# Patient Record
Sex: Male | Born: 1940 | Race: White | Hispanic: No | Marital: Married
Health system: Southern US, Community
[De-identification: ages and names within clinical notes are randomized; demographics above are authoritative.]

## PROBLEM LIST (undated history)

## (undated) DIAGNOSIS — H9190 Unspecified hearing loss, unspecified ear: Secondary | ICD-10-CM

## (undated) DIAGNOSIS — E7800 Pure hypercholesterolemia, unspecified: Secondary | ICD-10-CM

## (undated) HISTORY — PX: HERNIA REPAIR: SHX51

---

## 2009-07-05 ENCOUNTER — Ambulatory Visit: Payer: Self-pay | Admitting: Internal Medicine

## 2011-01-11 ENCOUNTER — Ambulatory Visit: Payer: Self-pay

## 2011-03-28 ENCOUNTER — Ambulatory Visit: Payer: Self-pay

## 2011-10-22 ENCOUNTER — Ambulatory Visit: Payer: Self-pay | Admitting: Internal Medicine

## 2015-02-05 ENCOUNTER — Ambulatory Visit
Admission: EM | Admit: 2015-02-05 | Discharge: 2015-02-05 | Disposition: A | Discharge status: Home / Self Care | Payer: Medicare Other | Attending: Family Medicine | Admitting: Family Medicine

## 2015-02-05 DIAGNOSIS — H9201 Otalgia, right ear: Secondary | ICD-10-CM

## 2015-02-05 HISTORY — DX: Pure hypercholesterolemia, unspecified: E78.00

## 2015-02-05 HISTORY — DX: Unspecified hearing loss, unspecified ear: H91.90

## 2015-02-05 NOTE — Discharge Instructions (Signed)
Follow up with ENT next week as discussed as needed. Return to Urgent care as needed for new or worsening concerns.   Earache An earache, also called otalgia, can be caused by many things. Pain from an earache can be sharp, dull, or burning. The pain may be temporary or constant. Earaches can be caused by problems with the ear, such as infection in either the middle ear or the ear canal, injury, impacted ear wax, middle ear pressure, or a foreign body in the ear. Ear pain can also result from problems in other areas. This is called referred pain. For example, pain can come from a sore throat, a tooth infection, or problems with the jaw or the joint between the jaw and the skull (temporomandibular joint, or TMJ). The cause of an earache is not always easy to identify. Watchful waiting may be appropriate for some earaches until a clear cause of the pain can be found. HOME CARE INSTRUCTIONS Watch your condition for any changes. The following actions may help to lessen any discomfort that you are feeling:  Take medicines only as directed by your health care provider. This includes ear drops.  Apply ice to your outer ear to help reduce pain.  Put ice in a plastic bag.  Place a towel between your skin and the bag.  Leave the ice on for 20 minutes, 2-3 times per day.  Do not put anything in your ear other than medicine that is prescribed by your health care provider.  Try resting in an upright position instead of lying down. This may help to reduce pressure in the middle ear and relieve pain.  Chew gum if it helps to relieve your ear pain.  Control any allergies that you have.  Keep all follow-up visits as directed by your health care provider. This is important. SEEK MEDICAL CARE IF:  Your pain does not improve within 2 days.  You have a fever.  You have new or worsening symptoms. SEEK IMMEDIATE MEDICAL CARE IF:  You have a severe headache.  You have a stiff neck.  You have  difficulty swallowing.  You have redness or swelling behind your ear.  You have drainage from your ear.  You have hearing loss.  You feel dizzy.   This information is not intended to replace advice given to you by your health care provider. Make sure you discuss any questions you have with your health care provider.   Document Released: 10/14/2003 Document Revised: 03/19/2014 Document Reviewed: 09/27/2013 Elsevier Interactive Patient Education [employer]

## 2015-02-05 NOTE — ED Provider Notes (Signed)
[facility]  ____________________________________________  Time seen: Approximately 12:17 PM  I have reviewed the triage vital signs and the nursing notes.   HISTORY  Chief Complaint Otalgia   HPI Sheron Nightingaledrian B Broeker is a 74 y.o. male presents with wife at bedside for the complaints of right ear discomfort. States right ear discomfort is not really a pain but and aggravation. States mild ache. States that this has been for 3 days. States that he is concerned that he may have a part of his hearing aid present in his ear. States that they small and piece of his hearing aid fell off and states wanted to make sure that it is not in his right ear. Denies discharge. Denies change in hearing. Patient does report that he has some chronic tinnitus, but denies any changes in that. Denies fall or trauma or head injury. Denies fevers, neck pain, facial pain or other complaints. Reports continues to eat and drink well. Denies recent cough, congestion, nasal drainage or sickness.   States has followed with Dr Irving ShowsJuengal ENT in past for tinnitus.    Past Medical History  Diagnosis Date  . High cholesterol   . Hard of hearing     There are no active problems to display for this patient.   Past Surgical History  Procedure Laterality Date  . Hernia repair      No current outpatient prescriptions on file.  Allergies Review of patient's allergies indicates no known allergies.  No family history on file.  Social History Social History  Substance Use Topics  . Smoking status: Unknown If Ever Smoked  . Smokeless tobacco: Not on file  . Alcohol Use: No    Review of Systems Constitutional: No fever/chills Eyes: No visual changes. ENT: No sore throat.right ear discomfort.  Cardiovascular: Denies chest pain. Respiratory: Denies shortness of breath. Gastrointestinal: No abdominal pain.  No nausea, no vomiting.  No diarrhea.  No constipation. Genitourinary: Negative for  dysuria. Musculoskeletal: Negative for back pain. Skin: Negative for rash. Neurological: Negative for headaches, focal weakness or numbness.  10-point ROS otherwise negative.  ____________________________________________   PHYSICAL EXAM:  VITAL SIGNS: ED Triage Vitals  Enc Vitals Group     BP 02/05/15 1057 164/85 mmHg     Pulse Rate 02/05/15 1057 70     Resp 02/05/15 1057 16     Temp 02/05/15 1057 98 F (36.7 C)     Temp Source 02/05/15 1057 Oral     SpO2 02/05/15 1057 100 %     Weight 02/05/15 1057 160 lb (72.576 kg)     Height 02/05/15 1057 5' 8.5" (1.74 m)     Head Cir --      Peak Flow --      Pain Score 02/05/15 1103 3     Pain Loc --      Pain Edu? --      Excl. in GC? --      Constitutional: Alert and oriented. Well appearing and in no acute distress. Eyes: Conjunctivae are normal. PERRL. EOMI. Head: Atraumatic.non tender. No sinus TTP. No TMJ tenderness. No erythema.   Ears: Left: no erythema, normal TM, nontender. Right: no erythema, mild cerumen present, nontender, TM intact, no foreign body, nontender. Hearing grossly intact bilaterally.  Nose: No congestion/rhinnorhea.  Mouth/Throat: Mucous membranes are moist.  Oropharynx non-erythematous. Neck: No stridor.  No cervical spine tenderness to palpation. Hematological/Lymphatic/Immunilogical: No cervical lymphadenopathy. Cardiovascular: Normal rate, regular rhythm. Grossly normal heart sounds.  Good peripheral circulation.  Respiratory: Normal respiratory effort.  No retractions. Lungs CTAB. Gastrointestinal: Soft and nontender. No distention. Normal Bowel sounds.   Musculoskeletal: No lower or upper extremity tenderness nor edema.   Neurologic:  Normal speech and language. No gross focal neurologic deficits are appreciated. No gait instability. Skin:  Skin is warm, dry and intact. No rash noted. Psychiatric: Mood and affect are normal. Speech and behavior are  normal.  ____________________________________________   LABS (all labs ordered are listed, but only abnormal results are displayed)  Labs Reviewed - No data to display   PROCEDURES  Procedure(s) performed:   Right ear irrigated by RN. Post irrigation reexamined. No cerumen present. No foreign body. TM normal in appearance. ____________________________________________   INITIAL IMPRESSION / ASSESSMENT AND PLAN / ED COURSE  Pertinent labs & imaging results that were available during my care of the patient were reviewed by me and considered in my medical decision making (see chart for details).  Very well-appearing patient. No acute distress. Presents for the complaints of right ear discomfort. States concerned that part of his hearing aid is in his ear. States wanted to make sure there was not anything in his ear. No foreign body seen. Irrigated to ensure no foreign bodies, and post irrigation no foreign body seen. No signs of infection. Denies hearing changes. Reports some chronic intermittent tinnitus. Denies tinnitus changes. Denies other complaints. Counseled regarding avoidance of frequent irritation to right ear as well as avoidance of any foreign bodies. Discussed monitoring. Follow-up with Dr. Irving Shows  ENT next week as needed. Patient and spouse verbal understanding.  Discussed follow up with Primary care physician this week. Discussed follow up and return parameters including no resolution or any worsening concerns. Patient verbalized understanding and agreed to plan.   ____________________________________________   FINAL CLINICAL IMPRESSION(S) / ED DIAGNOSES  Final diagnoses:  Otalgia, right       Renford Dills, NP 02/05/15 1242  Renford Dills, NP 02/05/15 1242

## 2016-11-07 ENCOUNTER — Other Ambulatory Visit: Payer: Self-pay | Admitting: Orthopedic Surgery

## 2016-11-07 DIAGNOSIS — M7582 Other shoulder lesions, left shoulder: Secondary | ICD-10-CM

## 2016-11-13 ENCOUNTER — Ambulatory Visit
Admission: RE | Admit: 2016-11-13 | Discharge: 2016-11-13 | Disposition: A | Discharge status: Home / Self Care | Payer: Medicare Other | Source: Ambulatory Visit | Attending: Orthopedic Surgery | Admitting: Orthopedic Surgery

## 2016-11-13 DIAGNOSIS — M75102 Unspecified rotator cuff tear or rupture of left shoulder, not specified as traumatic: Secondary | ICD-10-CM | POA: Insufficient documentation

## 2016-11-13 DIAGNOSIS — M19012 Primary osteoarthritis, left shoulder: Secondary | ICD-10-CM | POA: Diagnosis not present

## 2016-11-13 DIAGNOSIS — M7552 Bursitis of left shoulder: Secondary | ICD-10-CM | POA: Diagnosis not present

## 2016-11-13 DIAGNOSIS — M7582 Other shoulder lesions, left shoulder: Secondary | ICD-10-CM | POA: Diagnosis present

## 2018-02-01 IMAGING — MR MR SHOULDER*L* W/O CM
4 series · 40 of 40 positions shown · non-contrast
Comparison: None.

CLINICAL DATA: Lateral left shoulder pain and limited range of
motion for 2 months. No known injury.

EXAM:
MRI OF THE LEFT SHOULDER WITHOUT CONTRAST
TECHNIQUE: Multiplanar, multisequence MR imaging of the shoulder was performed.
No intravenous contrast was administered.

[Series 3: T2 fat-sat · axial · 4.0mm · 0.47mm/px · z∈[-32,+73]mm · 11 of 25 slices shown (1 of 2)]
[im 1/25]
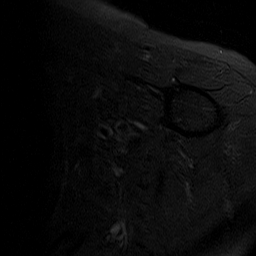
[im 3/25]
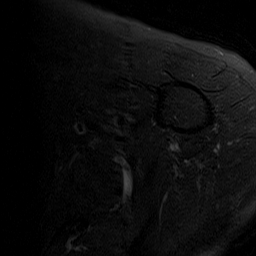
[im 5/25]
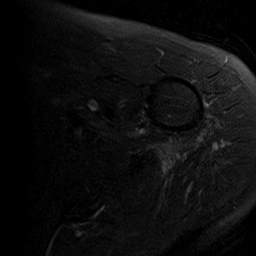
[im 8/25]
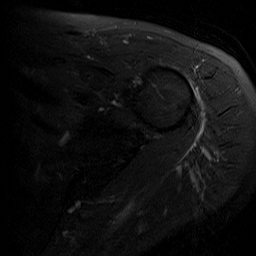
[im 10/25]
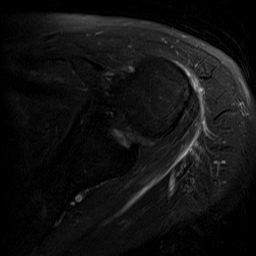
[im 13/25]
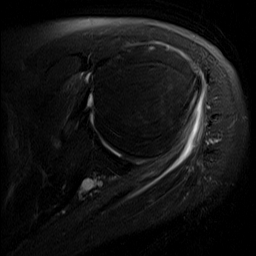
[im 15/25]
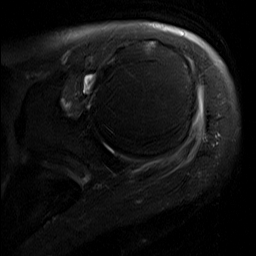
[im 17/25]
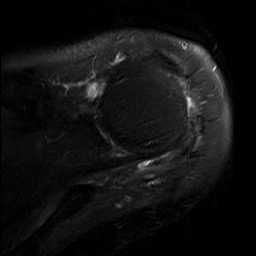
[im 20/25]
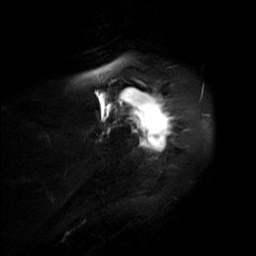
[im 22/25]
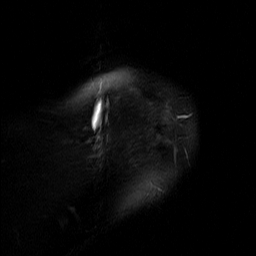
[im 25/25]
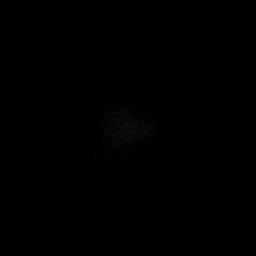

[Series 4: T2 fat-sat · oblique · 4.0mm · 0.62mm/px · 10 of 23 slices shown (2 of 2)]
[im 1/23]
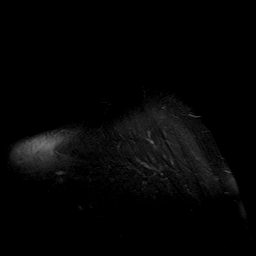
[im 3/23]
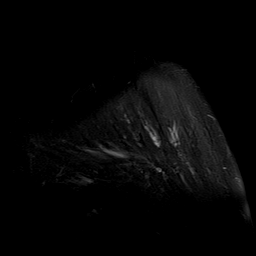
[im 5/23]
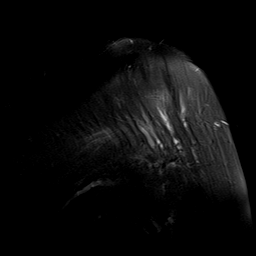
[im 8/23]
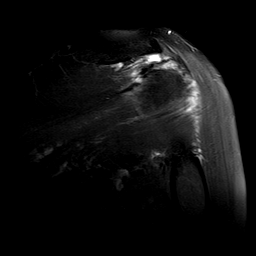
[im 10/23]
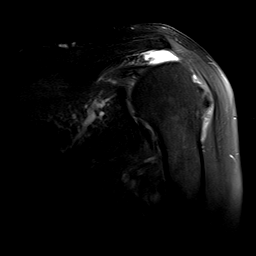
[im 13/23]
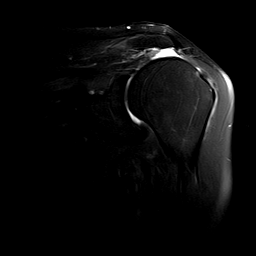
[im 15/23]
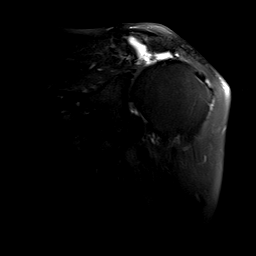
[im 18/23]
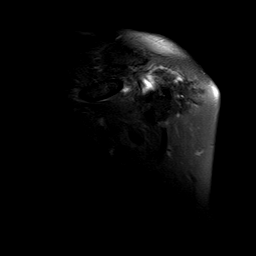
[im 20/23]
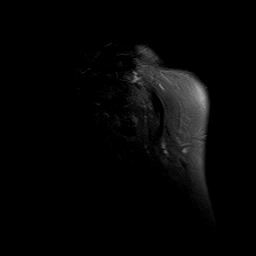
[im 23/23]
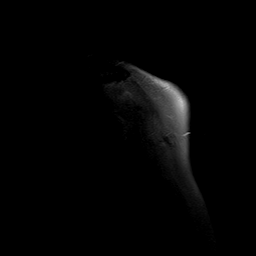

[Series 5: PD · oblique · 4.0mm · 0.62mm/px · 10 of 23 slices shown]
[im 1/23]
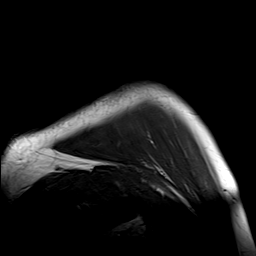
[im 3/23]
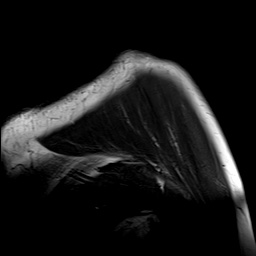
[im 5/23]
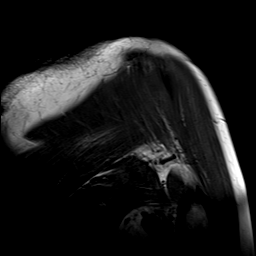
[im 8/23]
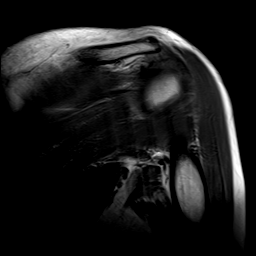
[im 10/23]
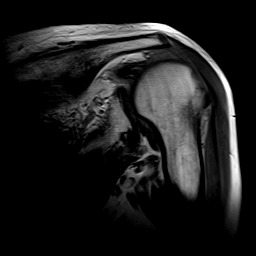
[im 13/23]
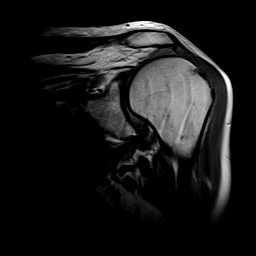
[im 15/23]
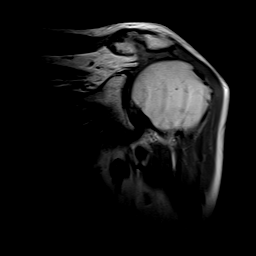
[im 18/23]
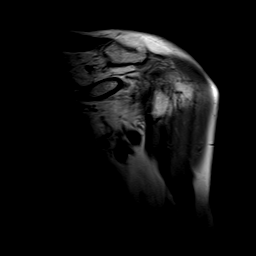
[im 20/23]
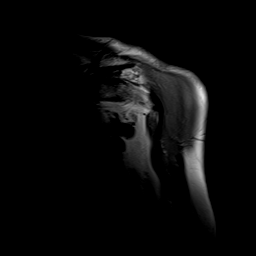
[im 23/23]
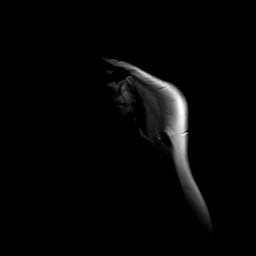

[Series 6: T1 · oblique · 4.0mm · 0.62mm/px · 9 of 21 slices shown]
[im 1/21]
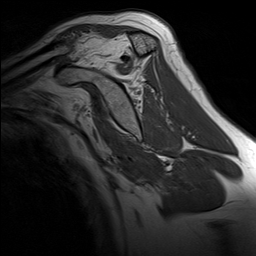
[im 3/21]
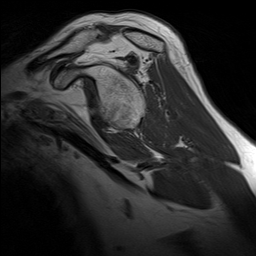
[im 6/21]
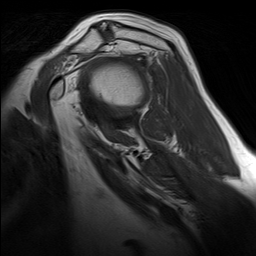
[im 8/21]
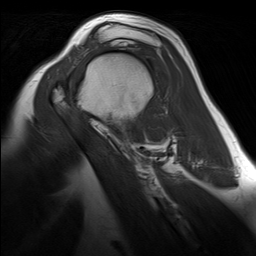
[im 11/21]
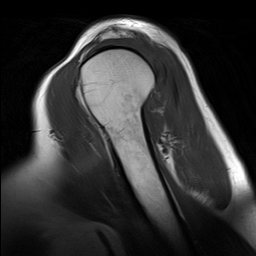
[im 13/21]
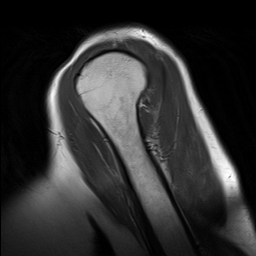
[im 16/21]
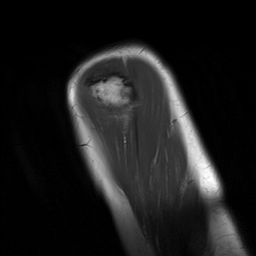
[im 18/21]
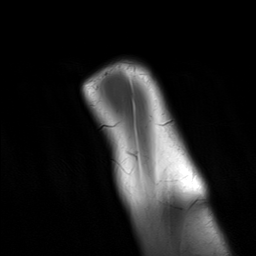
[im 21/21]
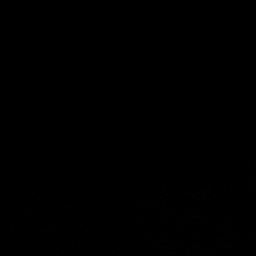

[40 of 40 positions shown; findings below may reference images not displayed]

FINDINGS: No sagittal T2 weighted image was obtained as the patient could not
tolerate further scanning.

Rotator cuff: The supraspinatus is completely torn and retracted to
approximately the level of the glenoid, 3.5-4.5 cm. There appears to
be a partial tear of the anterior fibers of the infraspinatus but
lack of sagittal imaging limits evaluation. The subscapularis and
teres minor are intact.

Muscles: Moderate fatty atrophy of the supraspinatus is identified.
Musculature is otherwise preserved.

Biceps long head: The tendon is completely torn from the superior
labrum.

Acromioclavicular Joint: Moderate to moderately severe degenerative
change is present. Type [DATE] acromion. There is fluid in the
subacromial/subdeltoid bursa.

Glenohumeral Joint: Cartilage thinning is noted without focal defect
or reactive marrow signal change.

Labrum:  The superior labrum is markedly degenerated.

Bones:  No fracture or worrisome lesion.

Other: None.
IMPRESSION: Complete supraspinatus tendon tear with 3.5-4.5 cm of retraction and
moderate atrophy of the muscle belly.

Marked appearing infraspinatus tendinopathy with a likely tear of
the leading edge of the tendon showing mild retraction but no
atrophy. Lack of the sagittal T2 weighted sequence somewhat limits
evaluation.

Complete tear of the long head of biceps from the superior labrum.

Moderate to moderately severe acromioclavicular osteoarthritis.
Configuration of the acromion likely impinges on the cuff.

Subacromial/subdeltoid bursitis.

## 2023-08-09 ENCOUNTER — Other Ambulatory Visit: Payer: Self-pay | Admitting: Family Medicine

## 2023-08-09 DIAGNOSIS — R109 Unspecified abdominal pain: Secondary | ICD-10-CM
